# Patient Record
Sex: Male | Born: 1970 | Race: White | Hispanic: No | Marital: Married | Smoking: Never smoker
Health system: Southern US, Community
[De-identification: ages and names within clinical notes are randomized; demographics above are authoritative.]

## PROBLEM LIST (undated history)

## (undated) DIAGNOSIS — M1990 Unspecified osteoarthritis, unspecified site: Secondary | ICD-10-CM

## (undated) DIAGNOSIS — A770 Spotted fever due to Rickettsia rickettsii: Secondary | ICD-10-CM

## (undated) DIAGNOSIS — G039 Meningitis, unspecified: Secondary | ICD-10-CM

## (undated) HISTORY — PX: NECK SURGERY: SHX720

## (undated) HISTORY — PX: KNEE ARTHROSCOPY: SUR90

---

## 2012-09-05 HISTORY — PX: CARPAL TUNNEL RELEASE: SHX101

## 2017-03-23 ENCOUNTER — Other Ambulatory Visit: Payer: Self-pay | Admitting: Orthopedic Surgery

## 2017-04-05 NOTE — Pre-Procedure Instructions (Signed)
Derrick Padilla  04/05/2017      [facility] [place] - [address] [place] [id number] Phone: [phone] Fax: [phone]    Your procedure is scheduled on April 12, 2017.  [facility] at 530 AM.  Call this number if you have problems the morning of surgery:  [phone]   Remember:  Do not eat food or drink liquids after midnight.  Take these medicines the morning of surgery with A SIP OF WATER (none).  7 days prior to surgery STOP taking any Aspirin, Aleve, Naproxen, Ibuprofen, Motrin, Advil, Goody's, BC's, all herbal medications, fish oil, and all vitamins   Do not wear jewelry, make-up or nail polish.  Do not wear lotions, powders, or perfumes, or deoderant.             Men may shave face and neck.  Do not bring valuables to the hospital.  [facility] is not responsible for any belongings or valuables.  Contacts, dentures or bridgework may not be worn into surgery.  Leave your suitcase in the car.  After surgery it may be brought to your room.  For patients admitted to the hospital, discharge time will be determined by your treatment team.  Patients discharged the day of surgery will not be allowed to drive home.  Special instructions:   [place]- Preparing For Surgery  Before surgery, you can play an important role. Because skin is not sterile, your skin needs to be as free of germs as possible. You can reduce the number of germs on your skin by washing with CHG (chlorahexidine gluconate) Soap before surgery.  CHG is an antiseptic cleaner which kills germs and bonds with the skin to continue killing germs even after washing.  Please do not use if you have an allergy to CHG or antibacterial soaps. If your skin becomes reddened/irritated stop using the CHG.  Do not shave (including legs and underarms) for at least 48 hours prior to first CHG shower. It is OK to shave your  face.  Please follow these instructions carefully.   1. Shower the NIGHT BEFORE SURGERY and the MORNING OF SURGERY with CHG.   2. If you chose to wash your hair, wash your hair first as usual with your normal shampoo.  3. After you shampoo, rinse your hair and body thoroughly to remove the shampoo.  4. Use CHG as you would any other liquid soap. You can apply CHG directly to the skin and wash gently with a scrungie or a clean washcloth.   5. Apply the CHG Soap to your body ONLY FROM THE NECK DOWN.  Do not use on open wounds or open sores. Avoid contact with your eyes, ears, mouth and genitals (private parts). Wash genitals (private parts) with your normal soap.  6. Wash thoroughly, paying special attention to the area where your surgery will be performed.  7. Thoroughly rinse your body with warm water from the neck down.  8. DO NOT shower/wash with your normal soap after using and rinsing off the CHG Soap.  9. Pat yourself dry with a CLEAN TOWEL.   10. Wear CLEAN PAJAMAS   11. Place CLEAN SHEETS on your bed the night of your first shower and DO NOT SLEEP WITH PETS.    Day of Surgery: Do not apply any deodorants/lotions. Please wear clean clothes to the hospital/surgery center.     Please  read over the following fact sheets that you were given. Pain Booklet, Coughing and Deep Breathing, MRSA Information and Surgical Site Infection Prevention

## 2017-04-06 ENCOUNTER — Encounter (HOSPITAL_COMMUNITY): Payer: Self-pay

## 2017-04-06 ENCOUNTER — Encounter (HOSPITAL_COMMUNITY)
Admission: RE | Admit: 2017-04-06 | Discharge: 2017-04-06 | Disposition: A | Discharge status: Home / Self Care | Payer: Worker's Compensation | Source: Ambulatory Visit | Attending: Orthopedic Surgery | Admitting: Orthopedic Surgery

## 2017-04-06 ENCOUNTER — Ambulatory Visit (HOSPITAL_COMMUNITY)
Admission: RE | Admit: 2017-04-06 | Discharge: 2017-04-06 | Disposition: A | Discharge status: Home / Self Care | Payer: Worker's Compensation | Source: Ambulatory Visit | Attending: Orthopedic Surgery | Admitting: Orthopedic Surgery

## 2017-04-06 DIAGNOSIS — Z01812 Encounter for preprocedural laboratory examination: Secondary | ICD-10-CM | POA: Diagnosis not present

## 2017-04-06 DIAGNOSIS — Z01810 Encounter for preprocedural cardiovascular examination: Secondary | ICD-10-CM | POA: Insufficient documentation

## 2017-04-06 DIAGNOSIS — Z01818 Encounter for other preprocedural examination: Secondary | ICD-10-CM | POA: Diagnosis not present

## 2017-04-06 DIAGNOSIS — M79604 Pain in right leg: Secondary | ICD-10-CM | POA: Diagnosis not present

## 2017-04-06 DIAGNOSIS — M79605 Pain in left leg: Secondary | ICD-10-CM | POA: Insufficient documentation

## 2017-04-06 HISTORY — DX: Meningitis, unspecified: G03.9

## 2017-04-06 HISTORY — DX: Unspecified osteoarthritis, unspecified site: M19.90

## 2017-04-06 HISTORY — DX: Spotted fever due to Rickettsia rickettsii: A77.0

## 2017-04-06 LAB — COMPREHENSIVE METABOLIC PANEL
ALK PHOS: 59 U/L (ref 38–126)
ALT: 32 U/L (ref 17–63)
AST: 30 U/L (ref 15–41)
Albumin: 4.1 g/dL (ref 3.5–5.0)
Anion gap: 11 (ref 5–15)
BUN: 10 mg/dL (ref 6–20)
CO2: 25 mmol/L (ref 22–32)
Calcium: 8.9 mg/dL (ref 8.9–10.3)
Chloride: 103 mmol/L (ref 101–111)
Creatinine, Ser: 0.97 mg/dL (ref 0.61–1.24)
Glucose, Bld: 75 mg/dL (ref 65–99)
Potassium: 3.1 mmol/L — ABNORMAL LOW (ref 3.5–5.1)
SODIUM: 139 mmol/L (ref 135–145)
Total Bilirubin: 0.4 mg/dL (ref 0.3–1.2)
Total Protein: 7.2 g/dL (ref 6.5–8.1)

## 2017-04-06 LAB — URINALYSIS, ROUTINE W REFLEX MICROSCOPIC
Bilirubin Urine: NEGATIVE
Glucose, UA: NEGATIVE mg/dL
Hgb urine dipstick: NEGATIVE
KETONES UR: NEGATIVE mg/dL
LEUKOCYTES UA: NEGATIVE
NITRITE: NEGATIVE
PROTEIN: NEGATIVE mg/dL
Specific Gravity, Urine: 1.025 (ref 1.005–1.030)
pH: 5 (ref 5.0–8.0)

## 2017-04-06 LAB — CBC WITH DIFFERENTIAL/PLATELET
BASOS ABS: 0 10*3/uL (ref 0.0–0.1)
Basophils Relative: 0 %
Eosinophils Absolute: 0.1 10*3/uL (ref 0.0–0.7)
Eosinophils Relative: 2 %
HCT: 43.3 % (ref 39.0–52.0)
HEMOGLOBIN: 13.9 g/dL (ref 13.0–17.0)
LYMPHS ABS: 2.8 10*3/uL (ref 0.7–4.0)
LYMPHS PCT: 32 %
MCH: 27.6 pg (ref 26.0–34.0)
MCHC: 32.1 g/dL (ref 30.0–36.0)
MCV: 85.9 fL (ref 78.0–100.0)
Monocytes Absolute: 0.4 10*3/uL (ref 0.1–1.0)
Monocytes Relative: 5 %
NEUTROS PCT: 61 %
Neutro Abs: 5.4 10*3/uL (ref 1.7–7.7)
Platelets: 278 10*3/uL (ref 150–400)
RBC: 5.04 MIL/uL (ref 4.22–5.81)
RDW: 15.1 % (ref 11.5–15.5)
WBC: 8.8 10*3/uL (ref 4.0–10.5)

## 2017-04-06 LAB — PROTIME-INR
INR: 0.95
PROTHROMBIN TIME: 12.6 s (ref 11.4–15.2)

## 2017-04-06 LAB — SURGICAL PCR SCREEN
MRSA, PCR: NEGATIVE
STAPHYLOCOCCUS AUREUS: POSITIVE — AB

## 2017-04-06 LAB — APTT: aPTT: 34 seconds (ref 24–36)

## 2017-04-06 LAB — ABO/RH: ABO/RH(D): A POS

## 2017-04-06 NOTE — Progress Notes (Signed)
Call to Dr. Marshell Levanumonski's office, to clarify order for lumbar spine- left voicemail for Derrick Padilla to respond.

## 2017-04-06 NOTE — Progress Notes (Signed)
I called lab a second time and was informed that they had not received the blood.  I called  Facility and was told that they are working on the tube system at times time, other tubes have not arrived at their destintation.

## 2017-04-06 NOTE — Progress Notes (Signed)
   04/06/17 1546  OBSTRUCTIVE SLEEP APNEA  Have you ever been diagnosed with sleep apnea through a sleep study? No  Do you snore loudly (loud enough to be heard through closed doors)?  1  Do you often feel tired, fatigued, or sleepy during the daytime (such as falling asleep during driving or talking to someone)? 1  Has anyone observed you stop breathing during your sleep? 0  Do you have, or are you being treated for high blood pressure? 0  BMI more than 35 kg/m2? 1  Age > 50 (1-yes) 0  Neck circumference greater than:Male 16 inches or larger, Male 17inches or larger? 1  Male Gender (Yes=1) 1  Obstructive Sleep Apnea Score 5  Score 5 or greater  Results sent to PCP

## 2017-04-06 NOTE — Progress Notes (Signed)
Stop Brennan BaileyBang tool sent to PCP- Dr. Sunday Spillers. [place] wife of pt. Reports that he hasn't seen the MD in a couple of yrs. Pt. Denies all cardiac surveillance in the past, denies seeing any specialty practices. Pt. Denies all chest concerns.

## 2017-04-06 NOTE — Progress Notes (Addendum)
I called the main lab and asked about patient labs that are still pending, I was told that hey will follow up with lab tech.

## 2017-04-07 ENCOUNTER — Other Ambulatory Visit: Payer: Self-pay | Admitting: Orthopedic Surgery

## 2017-04-07 NOTE — Progress Notes (Signed)
I called a prescription for Mupirocin ointment to [facility] [place].

## 2017-04-11 MED ORDER — DEXTROSE 5 % IV SOLN
3.0000 g | INTRAVENOUS | Status: AC
  Administered 2017-04-12 (×3): 3 g via INTRAVENOUS
  Filled 2017-04-11: qty 3

## 2017-04-11 NOTE — Anesthesia Preprocedure Evaluation (Addendum)
Anesthesia Evaluation  Patient identified by MRN, date of birth, ID band Patient awake    Reviewed: Allergy & Precautions, NPO status , Patient's Chart, lab work & pertinent test results  Airway Mallampati: III  TM Distance: >3 FB Neck ROM: Full    Dental no notable dental hx.    Pulmonary neg pulmonary ROS,    Pulmonary exam normal breath sounds clear to auscultation       Cardiovascular negative cardio ROS Normal cardiovascular exam Rhythm:Regular Rate:Normal  ECG: NSR, rate 63   Neuro/Psych negative neurological ROS  negative psych ROS   GI/Hepatic negative GI ROS, Neg liver ROS,   Endo/Other  Morbid obesity  Renal/GU negative Renal ROS     Musculoskeletal negative musculoskeletal ROS (+)   Abdominal (+) + obese,   Peds  Hematology negative hematology ROS (+)   Anesthesia Other Findings   Reproductive/Obstetrics                            Anesthesia Physical Anesthesia Plan  ASA: II  Anesthesia Plan: General   Post-op Pain Management:    Induction: Intravenous  PONV Risk Score and Plan: 2 and Ondansetron, Dexamethasone and Midazolam  Airway Management Planned: Oral ETT and Video Laryngoscope Planned  Additional Equipment:   Intra-op Plan:   Post-operative Plan: Extubation in OR  Informed Consent: I have reviewed the patients History and Physical, chart, labs and discussed the procedure including the risks, benefits and alternatives for the proposed anesthesia with the patient or authorized representative who has indicated his/her understanding and acceptance.   Dental advisory given  Plan Discussed with: CRNA  Anesthesia Plan Comments:        Anesthesia Quick Evaluation

## 2017-04-12 ENCOUNTER — Inpatient Hospital Stay (HOSPITAL_COMMUNITY): Payer: Worker's Compensation | Admitting: Anesthesiology

## 2017-04-12 ENCOUNTER — Inpatient Hospital Stay (HOSPITAL_COMMUNITY)
Admission: RE | Admit: 2017-04-12 | Discharge: 2017-04-15 | DRG: 454 | Disposition: A | Discharge status: Home / Self Care | Payer: Worker's Compensation | Source: Ambulatory Visit | Attending: Orthopedic Surgery | Admitting: Orthopedic Surgery

## 2017-04-12 ENCOUNTER — Inpatient Hospital Stay (HOSPITAL_COMMUNITY): Payer: Worker's Compensation

## 2017-04-12 ENCOUNTER — Inpatient Hospital Stay (HOSPITAL_COMMUNITY)
Admission: RE | Disposition: A | Discharge status: Home / Self Care | Payer: Self-pay | Source: Ambulatory Visit | Attending: Orthopedic Surgery

## 2017-04-12 ENCOUNTER — Encounter (HOSPITAL_COMMUNITY): Payer: Self-pay

## 2017-04-12 DIAGNOSIS — M4807 Spinal stenosis, lumbosacral region: Secondary | ICD-10-CM | POA: Diagnosis present

## 2017-04-12 DIAGNOSIS — M5117 Intervertebral disc disorders with radiculopathy, lumbosacral region: Secondary | ICD-10-CM | POA: Diagnosis present

## 2017-04-12 DIAGNOSIS — Z6841 Body Mass Index (BMI) 40.0 and over, adult: Secondary | ICD-10-CM

## 2017-04-12 DIAGNOSIS — Z419 Encounter for procedure for purposes other than remedying health state, unspecified: Secondary | ICD-10-CM

## 2017-04-12 DIAGNOSIS — M79605 Pain in left leg: Secondary | ICD-10-CM | POA: Diagnosis present

## 2017-04-12 DIAGNOSIS — M5410 Radiculopathy, site unspecified: Secondary | ICD-10-CM | POA: Diagnosis present

## 2017-04-12 DIAGNOSIS — D62 Acute posthemorrhagic anemia: Secondary | ICD-10-CM | POA: Diagnosis not present

## 2017-04-12 DIAGNOSIS — M5116 Intervertebral disc disorders with radiculopathy, lumbar region: Secondary | ICD-10-CM | POA: Diagnosis present

## 2017-04-12 DIAGNOSIS — M48061 Spinal stenosis, lumbar region without neurogenic claudication: Secondary | ICD-10-CM | POA: Diagnosis present

## 2017-04-12 SURGERY — POSTERIOR LUMBAR FUSION 3 LEVEL
Anesthesia: General | Site: Spine Lumbar | Laterality: Right

## 2017-04-12 MED ORDER — SODIUM CHLORIDE 0.9% FLUSH
3.0000 mL | Freq: Two times a day (BID) | INTRAVENOUS | Status: DC
  Administered 2017-04-12 – 2017-04-15 (×6): 3 mL via INTRAVENOUS

## 2017-04-12 MED ORDER — SODIUM CHLORIDE 0.9 % IV SOLN
0.0500 ug/kg/min | INTRAVENOUS | Status: AC
  Administered 2017-04-12: 0.05 ug/kg/min via INTRAVENOUS
  Filled 2017-04-12: qty 5000

## 2017-04-12 MED ORDER — ARTIFICIAL TEARS OPHTHALMIC OINT
TOPICAL_OINTMENT | OPHTHALMIC | Status: AC
  Filled 2017-04-12: qty 3.5

## 2017-04-12 MED ORDER — PROPOFOL 500 MG/50ML IV EMUL
INTRAVENOUS | Status: DC | PRN
  Administered 2017-04-12: 13:00:00 via INTRAVENOUS
  Administered 2017-04-12: 50 ug/kg/min via INTRAVENOUS
  Administered 2017-04-12: 11:00:00 via INTRAVENOUS

## 2017-04-12 MED ORDER — OXYCODONE HCL 5 MG/5ML PO SOLN: 5.0000 mg | Freq: Once | ORAL | Status: DC | PRN

## 2017-04-12 MED ORDER — SENNOSIDES-DOCUSATE SODIUM 8.6-50 MG PO TABS: 1.0000 | ORAL_TABLET | Freq: Every evening | ORAL | Status: DC | PRN

## 2017-04-12 MED ORDER — REMIFENTANIL HCL 1 MG IV SOLR: INTRAVENOUS | Status: DC | PRN

## 2017-04-12 MED ORDER — ZOLPIDEM TARTRATE 5 MG PO TABS: 5.0000 mg | ORAL_TABLET | Freq: Every evening | ORAL | Status: DC | PRN

## 2017-04-12 MED ORDER — SODIUM CHLORIDE 0.9 % IV SOLN
INTRAVENOUS | Status: DC | PRN
  Administered 2017-04-12: 09:00:00 via INTRAVENOUS

## 2017-04-12 MED ORDER — 0.9 % SODIUM CHLORIDE (POUR BTL) OPTIME
TOPICAL | Status: DC | PRN
  Administered 2017-04-12: 1000 mL
  Administered 2017-04-12: 250 mL
  Administered 2017-04-12 (×7): 1000 mL

## 2017-04-12 MED ORDER — OXYCODONE-ACETAMINOPHEN 5-325 MG PO TABS
1.0000 | ORAL_TABLET | ORAL | Status: DC | PRN
  Administered 2017-04-13 – 2017-04-15 (×11): 2 via ORAL
  Filled 2017-04-12 (×11): qty 2

## 2017-04-12 MED ORDER — HYDROMORPHONE HCL 1 MG/ML IJ SOLN
INTRAMUSCULAR | Status: AC
  Filled 2017-04-12: qty 1

## 2017-04-12 MED ORDER — LIDOCAINE 2% (20 MG/ML) 5 ML SYRINGE
INTRAMUSCULAR | Status: AC
  Filled 2017-04-12: qty 5

## 2017-04-12 MED ORDER — ARTIFICIAL TEARS OPHTHALMIC OINT
TOPICAL_OINTMENT | OPHTHALMIC | Status: DC | PRN
  Administered 2017-04-12: 1 via OPHTHALMIC

## 2017-04-12 MED ORDER — FLEET ENEMA 7-19 GM/118ML RE ENEM: 1.0000 | ENEMA | Freq: Once | RECTAL | Status: DC | PRN

## 2017-04-12 MED ORDER — BUPIVACAINE LIPOSOME 1.3 % IJ SUSP
20.0000 mL | INTRAMUSCULAR | Status: AC
  Administered 2017-04-12: 20 mL
  Filled 2017-04-12: qty 20

## 2017-04-12 MED ORDER — SODIUM CHLORIDE 0.9 % IV SOLN: 250.0000 mL | INTRAVENOUS | Status: DC

## 2017-04-12 MED ORDER — ONDANSETRON HCL 4 MG PO TABS: 4.0000 mg | ORAL_TABLET | Freq: Four times a day (QID) | ORAL | Status: DC | PRN

## 2017-04-12 MED ORDER — ONDANSETRON HCL 4 MG/2ML IJ SOLN
INTRAMUSCULAR | Status: AC
  Filled 2017-04-12: qty 2

## 2017-04-12 MED ORDER — THROMBIN 20000 UNITS EX SOLR
CUTANEOUS | Status: AC
  Filled 2017-04-12: qty 40000

## 2017-04-12 MED ORDER — FENTANYL CITRATE (PF) 250 MCG/5ML IJ SOLN
INTRAMUSCULAR | Status: AC
  Filled 2017-04-12: qty 5

## 2017-04-12 MED ORDER — ROCURONIUM BROMIDE 100 MG/10ML IV SOLN
INTRAVENOUS | Status: DC | PRN
  Administered 2017-04-12 (×3): 25 mg via INTRAVENOUS
  Administered 2017-04-12: 60 mg via INTRAVENOUS
  Administered 2017-04-12: 25 mg via INTRAVENOUS
  Administered 2017-04-12: 40 mg via INTRAVENOUS

## 2017-04-12 MED ORDER — METHYLENE BLUE 0.5 % INJ SOLN
INTRAVENOUS | Status: AC
  Filled 2017-04-12: qty 10

## 2017-04-12 MED ORDER — MIDAZOLAM HCL 2 MG/2ML IJ SOLN
INTRAMUSCULAR | Status: AC
  Filled 2017-04-12: qty 2

## 2017-04-12 MED ORDER — LIDOCAINE HCL (CARDIAC) 20 MG/ML IV SOLN
INTRAVENOUS | Status: DC | PRN
  Administered 2017-04-12: 60 mg via INTRATRACHEAL

## 2017-04-12 MED ORDER — DOCUSATE SODIUM 100 MG PO CAPS
100.0000 mg | ORAL_CAPSULE | Freq: Two times a day (BID) | ORAL | Status: DC
  Administered 2017-04-12 – 2017-04-15 (×6): 100 mg via ORAL
  Filled 2017-04-12 (×6): qty 1

## 2017-04-12 MED ORDER — PANTOPRAZOLE SODIUM 40 MG IV SOLR
40.0000 mg | Freq: Every day | INTRAVENOUS | Status: DC
  Administered 2017-04-12: 40 mg via INTRAVENOUS
  Filled 2017-04-12: qty 40

## 2017-04-12 MED ORDER — DIAZEPAM 5 MG PO TABS
5.0000 mg | ORAL_TABLET | Freq: Four times a day (QID) | ORAL | Status: DC | PRN
  Administered 2017-04-13 – 2017-04-15 (×7): 5 mg via ORAL
  Filled 2017-04-12 (×7): qty 1

## 2017-04-12 MED ORDER — PHENYLEPHRINE HCL 10 MG/ML IJ SOLN
INTRAVENOUS | Status: DC | PRN
  Administered 2017-04-12: 12:00:00 via INTRAVENOUS
  Administered 2017-04-12: 25 ug/min via INTRAVENOUS

## 2017-04-12 MED ORDER — ONDANSETRON HCL 4 MG/2ML IJ SOLN
INTRAMUSCULAR | Status: DC | PRN
  Administered 2017-04-12 (×2): 4 mg via INTRAVENOUS

## 2017-04-12 MED ORDER — POVIDONE-IODINE 7.5 % EX SOLN
Freq: Once | CUTANEOUS | Status: DC
  Filled 2017-04-12: qty 118

## 2017-04-12 MED ORDER — CEFAZOLIN SODIUM-DEXTROSE 2-4 GM/100ML-% IV SOLN
INTRAVENOUS | Status: AC
  Filled 2017-04-12: qty 100

## 2017-04-12 MED ORDER — GLYCOPYRROLATE 0.2 MG/ML IJ SOLN
INTRAMUSCULAR | Status: DC | PRN
  Administered 2017-04-12 (×3): 0.2 mg via INTRAVENOUS

## 2017-04-12 MED ORDER — ALUM & MAG HYDROXIDE-SIMETH 200-200-20 MG/5ML PO SUSP: 30.0000 mL | Freq: Four times a day (QID) | ORAL | Status: DC | PRN

## 2017-04-12 MED ORDER — PROMETHAZINE HCL 25 MG/ML IJ SOLN: 6.2500 mg | INTRAMUSCULAR | Status: DC | PRN

## 2017-04-12 MED ORDER — SODIUM CHLORIDE 0.9 % IV SOLN
INTRAVENOUS | Status: DC | PRN
  Administered 2017-04-12: 11:00:00 via INTRAVENOUS

## 2017-04-12 MED ORDER — HEMOSTATIC AGENTS (NO CHARGE) OPTIME
TOPICAL | Status: DC | PRN
  Administered 2017-04-12: 1 via TOPICAL

## 2017-04-12 MED ORDER — SUGAMMADEX SODIUM 500 MG/5ML IV SOLN
INTRAVENOUS | Status: DC | PRN
  Administered 2017-04-12: 300 mg via INTRAVENOUS

## 2017-04-12 MED ORDER — PHENYLEPHRINE HCL 10 MG/ML IJ SOLN
INTRAMUSCULAR | Status: DC | PRN
  Administered 2017-04-12 (×2): 80 ug via INTRAVENOUS
  Administered 2017-04-12: 40 ug via INTRAVENOUS
  Administered 2017-04-12: 80 ug via INTRAVENOUS
  Administered 2017-04-12: 120 ug via INTRAVENOUS

## 2017-04-12 MED ORDER — PROPOFOL 10 MG/ML IV BOLUS
INTRAVENOUS | Status: AC
  Filled 2017-04-12: qty 20

## 2017-04-12 MED ORDER — CEFAZOLIN SODIUM-DEXTROSE 2-4 GM/100ML-% IV SOLN
2.0000 g | Freq: Three times a day (TID) | INTRAVENOUS | Status: AC
  Administered 2017-04-13 (×2): 2 g via INTRAVENOUS
  Filled 2017-04-12 (×2): qty 100

## 2017-04-12 MED ORDER — PROPOFOL 10 MG/ML IV BOLUS
INTRAVENOUS | Status: DC | PRN
  Administered 2017-04-12: 200 mg via INTRAVENOUS

## 2017-04-12 MED ORDER — CEFAZOLIN SODIUM 1 G IJ SOLR
INTRAMUSCULAR | Status: AC
  Filled 2017-04-12: qty 30

## 2017-04-12 MED ORDER — ONDANSETRON HCL 4 MG/2ML IJ SOLN: 4.0000 mg | Freq: Four times a day (QID) | INTRAMUSCULAR | Status: DC | PRN

## 2017-04-12 MED ORDER — ROCURONIUM BROMIDE 10 MG/ML (PF) SYRINGE
PREFILLED_SYRINGE | INTRAVENOUS | Status: AC
  Filled 2017-04-12: qty 5

## 2017-04-12 MED ORDER — SODIUM CHLORIDE 0.9 % IV BOLUS (SEPSIS)
500.0000 mL | Freq: Once | INTRAVENOUS | Status: AC
  Administered 2017-04-12: 500 mL via INTRAVENOUS

## 2017-04-12 MED ORDER — ACETAMINOPHEN 650 MG RE SUPP: 650.0000 mg | RECTAL | Status: DC | PRN

## 2017-04-12 MED ORDER — SODIUM CHLORIDE 0.9 % IV BOLUS (SEPSIS)
1000.0000 mL | Freq: Once | INTRAVENOUS | Status: AC
  Administered 2017-04-12: 1000 mL via INTRAVENOUS

## 2017-04-12 MED ORDER — SUGAMMADEX SODIUM 500 MG/5ML IV SOLN
INTRAVENOUS | Status: AC
  Filled 2017-04-12: qty 5

## 2017-04-12 MED ORDER — ACETAMINOPHEN 325 MG PO TABS: 650.0000 mg | ORAL_TABLET | ORAL | Status: DC | PRN

## 2017-04-12 MED ORDER — POTASSIUM CHLORIDE IN NACL 20-0.9 MEQ/L-% IV SOLN
INTRAVENOUS | Status: DC
  Administered 2017-04-12 – 2017-04-13 (×2): via INTRAVENOUS
  Filled 2017-04-12 (×2): qty 1000

## 2017-04-12 MED ORDER — THROMBIN 20000 UNITS EX KIT
PACK | CUTANEOUS | Status: DC | PRN
  Administered 2017-04-12 (×2): 20000 [IU] via TOPICAL

## 2017-04-12 MED ORDER — MORPHINE SULFATE (PF) 2 MG/ML IV SOLN
1.0000 mg | INTRAVENOUS | Status: DC | PRN
  Administered 2017-04-12 – 2017-04-13 (×2): 2 mg via INTRAVENOUS
  Filled 2017-04-12 (×2): qty 1

## 2017-04-12 MED ORDER — BISACODYL 5 MG PO TBEC: 5.0000 mg | DELAYED_RELEASE_TABLET | Freq: Every day | ORAL | Status: DC | PRN

## 2017-04-12 MED ORDER — PHENOL 1.4 % MT LIQD
1.0000 | OROMUCOSAL | Status: DC | PRN
  Administered 2017-04-15: 1 via OROMUCOSAL
  Filled 2017-04-12: qty 177

## 2017-04-12 MED ORDER — PHENYLEPHRINE 40 MCG/ML (10ML) SYRINGE FOR IV PUSH (FOR BLOOD PRESSURE SUPPORT)
PREFILLED_SYRINGE | INTRAVENOUS | Status: AC
  Filled 2017-04-12: qty 10

## 2017-04-12 MED ORDER — HYDROMORPHONE HCL 1 MG/ML IJ SOLN
0.2500 mg | INTRAMUSCULAR | Status: DC | PRN
  Administered 2017-04-12 (×2): 0.5 mg via INTRAVENOUS

## 2017-04-12 MED ORDER — LACTATED RINGERS IV SOLN
INTRAVENOUS | Status: DC | PRN
  Administered 2017-04-12: 08:00:00 via INTRAVENOUS

## 2017-04-12 MED ORDER — METHYLENE BLUE 0.5 % INJ SOLN
INTRAVENOUS | Status: DC | PRN
  Administered 2017-04-12: 1 mL via SUBMUCOSAL

## 2017-04-12 MED ORDER — ALBUMIN HUMAN 5 % IV SOLN
INTRAVENOUS | Status: DC | PRN
  Administered 2017-04-12 (×2): via INTRAVENOUS

## 2017-04-12 MED ORDER — PROPOFOL 1000 MG/100ML IV EMUL
INTRAVENOUS | Status: AC
  Filled 2017-04-12: qty 400

## 2017-04-12 MED ORDER — BUPIVACAINE-EPINEPHRINE (PF) 0.25% -1:200000 IJ SOLN
INTRAMUSCULAR | Status: AC
  Filled 2017-04-12: qty 30

## 2017-04-12 MED ORDER — MENTHOL 3 MG MT LOZG: 1.0000 | LOZENGE | OROMUCOSAL | Status: DC | PRN

## 2017-04-12 MED ORDER — OXYCODONE HCL 5 MG PO TABS: 5.0000 mg | ORAL_TABLET | Freq: Once | ORAL | Status: DC | PRN

## 2017-04-12 MED ORDER — FENTANYL CITRATE (PF) 250 MCG/5ML IJ SOLN
INTRAMUSCULAR | Status: DC | PRN
  Administered 2017-04-12: 50 ug via INTRAVENOUS
  Administered 2017-04-12: 150 ug via INTRAVENOUS

## 2017-04-12 MED ORDER — BUPIVACAINE LIPOSOME 1.3 % IJ SUSP: INTRAMUSCULAR | Status: DC | PRN

## 2017-04-12 MED ORDER — SODIUM CHLORIDE 0.9% FLUSH: 3.0000 mL | INTRAVENOUS | Status: DC | PRN

## 2017-04-12 MED ORDER — BUPIVACAINE-EPINEPHRINE 0.25% -1:200000 IJ SOLN
INTRAMUSCULAR | Status: DC | PRN
  Administered 2017-04-12: 8 mL
  Administered 2017-04-12: 20 mL

## 2017-04-12 MED ORDER — MIDAZOLAM HCL 2 MG/2ML IJ SOLN
INTRAMUSCULAR | Status: DC | PRN
  Administered 2017-04-12 (×2): 1 mg via INTRAVENOUS

## 2017-04-12 SURGICAL SUPPLY — 92 items
BENZOIN TINCTURE PRP APPL 2/3 (GAUZE/BANDAGES/DRESSINGS) ×3 IMPLANT
BLADE CLIPPER SURG (BLADE) ×3 IMPLANT
BONE MATRIX VIVIGEN 16.CC/XL (Bone Implant) ×3 IMPLANT
BUR PRESCISION 1.7 ELITE (BURR) ×3 IMPLANT
BUR ROUND PRECISION 4.0 (BURR) IMPLANT
BUR ROUND PRECISION 4.0MM (BURR)
BUR SABER RD CUTTING 3.0 (BURR) IMPLANT
BUR SABER RD CUTTING 3.0MM (BURR)
CAGE CONCORDE BULLET 9X8X27 (Cage) ×3 IMPLANT
CAGE CONCORDE BULLET 9X8X27MM (Cage) ×3 IMPLANT
CAGE SPNL 5D BLT NOSE 27X9X8X (Cage) ×2 IMPLANT
CAGE SPNL PRLL BLT NOSE 27X9X8 (Cage) ×1 IMPLANT
CARTRIDGE OIL MAESTRO DRILL (MISCELLANEOUS) ×1 IMPLANT
CLOSURE WOUND 1/2 X4 (GAUZE/BANDAGES/DRESSINGS) ×2
COVER SURGICAL LIGHT HANDLE (MISCELLANEOUS) ×3 IMPLANT
DIFFUSER DRILL AIR PNEUMATIC (MISCELLANEOUS) ×3 IMPLANT
DRAIN CHANNEL 15F RND FF W/TCR (WOUND CARE) ×3 IMPLANT
DRAPE C-ARM 42X72 X-RAY (DRAPES) ×3 IMPLANT
DRAPE C-ARMOR (DRAPES) IMPLANT
DRAPE ORTHO SPLIT 77X108 STRL (DRAPES) ×2
DRAPE POUCH INSTRU U-SHP 10X18 (DRAPES) ×3 IMPLANT
DRAPE SURG 17X23 STRL (DRAPES) ×12 IMPLANT
DRAPE SURG ORHT 6 SPLT 77X108 (DRAPES) ×1 IMPLANT
DRSG MEPILEX BORDER 4X12 (GAUZE/BANDAGES/DRESSINGS) IMPLANT
DRSG MEPILEX BORDER 4X8 (GAUZE/BANDAGES/DRESSINGS) IMPLANT
DURAPREP 26ML APPLICATOR (WOUND CARE) ×3 IMPLANT
ELECT BLADE 4.0 EZ CLEAN MEGAD (MISCELLANEOUS) ×3
ELECT CAUTERY BLADE 6.4 (BLADE) ×3 IMPLANT
ELECT REM PT RETURN 9FT ADLT (ELECTROSURGICAL) ×3
ELECTRODE BLDE 4.0 EZ CLN MEGD (MISCELLANEOUS) ×1 IMPLANT
ELECTRODE REM PT RTRN 9FT ADLT (ELECTROSURGICAL) ×1 IMPLANT
EVACUATOR SILICONE 100CC (DRAIN) ×3 IMPLANT
EXPAREL 1. 3% 266MG IMPLANT
FEE INTRAOP MONITOR IMPULS NCS (MISCELLANEOUS) ×1 IMPLANT
FILTER STRAW FLUID ASPIR (MISCELLANEOUS) ×3 IMPLANT
GAUZE SPONGE 4X4 12PLY STRL (GAUZE/BANDAGES/DRESSINGS) ×3 IMPLANT
GAUZE SPONGE 4X4 16PLY XRAY LF (GAUZE/BANDAGES/DRESSINGS) ×12 IMPLANT
GLOVE BIO SURGEON STRL SZ7 (GLOVE) ×6 IMPLANT
GLOVE BIO SURGEON STRL SZ7.5 (GLOVE) ×6 IMPLANT
GLOVE BIO SURGEON STRL SZ8 (GLOVE) ×3 IMPLANT
GLOVE BIOGEL PI IND STRL 7.0 (GLOVE) ×1 IMPLANT
GLOVE BIOGEL PI IND STRL 8 (GLOVE) ×1 IMPLANT
GLOVE BIOGEL PI INDICATOR 7.0 (GLOVE) ×2
GLOVE BIOGEL PI INDICATOR 8 (GLOVE) ×2
GOWN STRL REUS W/ TWL LRG LVL3 (GOWN DISPOSABLE) ×2 IMPLANT
GOWN STRL REUS W/ TWL XL LVL3 (GOWN DISPOSABLE) ×1 IMPLANT
GOWN STRL REUS W/TWL LRG LVL3 (GOWN DISPOSABLE) ×4
GOWN STRL REUS W/TWL XL LVL3 (GOWN DISPOSABLE) ×2
INTRAOP MONITOR FEE IMPULS NCS (MISCELLANEOUS) ×1
INTRAOP MONITOR FEE IMPULSE (MISCELLANEOUS) ×2
IV CATH 14GX2 1/4 (CATHETERS) ×3 IMPLANT
KIT BASIN OR (CUSTOM PROCEDURE TRAY) ×3 IMPLANT
KIT POSITION SURG JACKSON T1 (MISCELLANEOUS) ×3 IMPLANT
KIT ROOM TURNOVER OR (KITS) ×3 IMPLANT
MARKER SKIN DUAL TIP RULER LAB (MISCELLANEOUS) ×6 IMPLANT
NEEDLE HYPO 25GX1X1/2 BEV (NEEDLE) ×3 IMPLANT
NEEDLE SPNL 18GX3.5 QUINCKE PK (NEEDLE) ×6 IMPLANT
NS IRRIG 1000ML POUR BTL (IV SOLUTION) ×27 IMPLANT
OIL CARTRIDGE MAESTRO DRILL (MISCELLANEOUS) ×3
PACK LAMINECTOMY ORTHO (CUSTOM PROCEDURE TRAY) ×3 IMPLANT
PACK UNIVERSAL I (CUSTOM PROCEDURE TRAY) ×3 IMPLANT
PAD ARMBOARD 7.5X6 YLW CONV (MISCELLANEOUS) ×6 IMPLANT
PATTIES SURGICAL .5 X1 (DISPOSABLE) ×3 IMPLANT
PATTIES SURGICAL .5 X3 (DISPOSABLE) ×3 IMPLANT
PATTIES SURGICAL .5X1.5 (GAUZE/BANDAGES/DRESSINGS) ×3 IMPLANT
PROBE BALL TIP PED SCREW 2.3M (INSTRUMENTS) ×3 IMPLANT
ROD EXPEDIUM PREBENT 85MM (Rod) ×6 IMPLANT
SCREW SET SINGLE INNER (Screw) ×24 IMPLANT
SCREW VIPER CORTICAL FIX 6X40 (Screw) ×24 IMPLANT
SPONGE INTESTINAL PEANUT (DISPOSABLE) ×6 IMPLANT
SPONGE LAP 4X18 X RAY DECT (DISPOSABLE) ×18 IMPLANT
SPONGE SURGIFOAM ABS GEL 100 (HEMOSTASIS) ×3 IMPLANT
STRIP CLOSURE SKIN 1/2X4 (GAUZE/BANDAGES/DRESSINGS) ×4 IMPLANT
STYLET INTUB SATIN SLIP 14FR (MISCELLANEOUS) ×3 IMPLANT
SURGIFLO W/THROMBIN 8M KIT (HEMOSTASIS) IMPLANT
SUT ETHILON 3 0 PS 1 (SUTURE) ×3 IMPLANT
SUT MNCRL AB 4-0 PS2 18 (SUTURE) ×3 IMPLANT
SUT VIC AB 0 CT1 18XCR BRD 8 (SUTURE) ×2 IMPLANT
SUT VIC AB 0 CT1 8-18 (SUTURE) ×4
SUT VIC AB 1 CT1 18XCR BRD 8 (SUTURE) ×2 IMPLANT
SUT VIC AB 1 CT1 8-18 (SUTURE) ×4
SUT VIC AB 2-0 CT2 18 VCP726D (SUTURE) ×6 IMPLANT
SYR 20CC LL (SYRINGE) ×3 IMPLANT
SYR BULB IRRIGATION 50ML (SYRINGE) ×3 IMPLANT
SYR CONTROL 10ML LL (SYRINGE) ×3 IMPLANT
SYR TB 1ML LUER SLIP (SYRINGE) ×3 IMPLANT
TAPE CLOTH SURG 6X10 WHT LF (GAUZE/BANDAGES/DRESSINGS) ×3 IMPLANT
TOWEL OR 17X24 6PK STRL BLUE (TOWEL DISPOSABLE) ×3 IMPLANT
TOWEL OR 17X26 10 PK STRL BLUE (TOWEL DISPOSABLE) ×3 IMPLANT
TRAY FOLEY W/METER SILVER 16FR (SET/KITS/TRAYS/PACK) ×3 IMPLANT
WATER STERILE IRR 1000ML POUR (IV SOLUTION) ×3 IMPLANT
YANKAUER SUCT BULB TIP NO VENT (SUCTIONS) ×3 IMPLANT

## 2017-04-12 NOTE — H&P (Signed)
     PREOPERATIVE H&P  Chief Complaint: Bilateral leg pain  HPI: Derrick Padilla is a 46 y.o. male who presents with ongoing pain in the bilateral legs x 9 months. Patient has failed multiple conservative treatment measures.   MRI reveals severe DDD in addition to disc protrusions and stenosis at L3/4, L4/5, and L5/S1  Patient has failed multiple forms of conservative care and continues to have pain (see office notes for additional details regarding the patient's full course of treatment)  Past Medical History:  Diagnosis Date  . Arthritis    degenerative spine, has had multiple injections   . Meningitis    (viral) - hosp.- [place]  . [place] tick fever    Past Surgical History:  Procedure Laterality Date  . CARPAL TUNNEL RELEASE Left 2014  . KNEE ARTHROSCOPY Left   . NECK SURGERY     cyst- excision - benign    Social History   Social History  . Marital status: Married    Spouse name: N/A  . Number of children: N/A  . Years of education: N/A   Social History Main Topics  . Smoking status: Never Smoker  . Smokeless tobacco: Never Used  . Alcohol use No  . Drug use: No  . Sexual activity: Not Asked   Other Topics Concern  . None   Social History Narrative  . None   History reviewed. No pertinent family history. Allergies  Allergen Reactions  . No Known Allergies    Prior to Admission medications   Medication Sig Start Date End Date Taking? Authorizing Provider  ibuprofen (ADVIL,MOTRIN) 200 MG tablet Take 200 mg by mouth every 6 (six) hours as needed.   Yes [provider]     All other systems have been reviewed and were otherwise negative with the exception of those mentioned in the HPI and as above.  Physical Exam: Vitals:   04/12/17 0705  BP: 140/75  Pulse: (!) 56  Resp: 18  Temp: 97.7 F (36.5 C)    General: Alert, no acute distress Cardiovascular: No pedal edema Respiratory: No cyanosis, no use of accessory musculature Skin:  No lesions in the area of chief complaint Neurologic: Sensation intact distally Psychiatric: Patient is competent for consent with normal mood and affect Lymphatic: No axillary or cervical lymphadenopathy   Assessment/Plan: L3-S1 DDD L3-S1 stenosis Bilateral leg pain  Plan for Procedure(s): RIGHT SIDED LUMBAR 3-4, LUMBAR 4-5, LUMBAR 5-SACRUM 1 DECOMPRESSION AND TRANSFORAMINAL LUMBAR INTERBODY FUSION WITH INSTRUMENTATION AND ALLOGRAFT   Emilee HeroUMONSKI,Zarria Towell [name], MD 04/12/2017 8:04 AM

## 2017-04-12 NOTE — Anesthesia Procedure Notes (Addendum)
Procedure Name: Intubation Date/Time: 04/12/2017 8:45 AM Performed by: Leonides GrillsELLENDER, RYAN P Pre-anesthesia Checklist: Patient identified, Emergency Drugs available, Suction available and Patient being monitored Patient Re-evaluated:Patient Re-evaluated prior to induction Oxygen Delivery Method: Circle system utilized Preoxygenation: Pre-oxygenation with 100% oxygen Induction Type: IV induction Ventilation: Mask ventilation with difficulty and Oral airway inserted - appropriate to patient size Laryngoscope Size: Glidescope (T4) Grade View: Grade III Tube type: Oral Tube size: 7.5 mm Number of attempts: 2 (Repositioned head and PPV) Airway Equipment and Method: Video-laryngoscopy Placement Confirmation: ETT inserted through vocal cords under direct vision,  positive ETCO2 and breath sounds checked- equal and bilateral Secured at: 23 cm Tube secured with: Tape Dental Injury: Teeth and Oropharynx as per pre-operative assessment  Difficulty Due To: Difficulty was anticipated Future Recommendations: Recommend- induction with short-acting agent, and alternative techniques readily available

## 2017-04-12 NOTE — Progress Notes (Signed)
Pt admitted from PACU post op, alert and oriented, c/o of pain with a rating of 6, pt settled in bed with call light and family at bedside, safety concerned addressed accordingly with pt and family, was however reassured and will continue to monitor. Obasogie-Asidi, Samaya Boardley Efe

## 2017-04-12 NOTE — Anesthesia Postprocedure Evaluation (Signed)
Anesthesia Post Note  Patient: Derrick Padilla  Procedure(s) Performed: Procedure(s) (LRB): RIGHT SIDED LUMBAR 3-4, LUMBAR 4-5, LUMBAR 5-SACRUM 1 TRANSFORAMINAL LUMBAR INTERBODY FUSION WITH INSTRUMENTATION AND ALLOGRAFT (Right)     Patient location during evaluation: PACU Anesthesia Type: General Level of consciousness: awake and alert Pain management: pain level controlled Vital Signs Assessment: post-procedure vital signs reviewed and stable Respiratory status: spontaneous breathing, nonlabored ventilation, respiratory function stable and patient connected to nasal cannula oxygen Cardiovascular status: blood pressure returned to baseline and stable Postop Assessment: no signs of nausea or vomiting Anesthetic complications: no    Last Vitals:  Vitals:   04/12/17 2130 04/12/17 2145  BP: 100/60 (!) 99/58  Pulse: 64 69  Resp: (!) 24 12  Temp: 36.6 C     Last Pain:  Vitals:   04/12/17 2100  TempSrc:   PainSc: Asleep                 [name] P Rual Vermeer

## 2017-04-12 NOTE — Transfer of Care (Signed)
Immediate Anesthesia Transfer of Care Note  Patient: Derrick Padilla  Procedure(s) Performed: Procedure(s) with comments: RIGHT SIDED LUMBAR 3-4, LUMBAR 4-5, LUMBAR 5-SACRUM 1 TRANSFORAMINAL LUMBAR INTERBODY FUSION WITH INSTRUMENTATION AND ALLOGRAFT (Right) - RIGHT SIDED LUMBAR 3-4, LUMBAR 4-5, LUMBAR 5-SACRUM 1 TRANSFORAMINAL LUMBAR INTERBODY FUSION WITH INSTRUMENTATION AND ALLOGRAFT; REQUEST 6.5 HOURS  Patient Location: PACU  Anesthesia Type:General  Level of Consciousness: oriented, sedated, drowsy, patient cooperative and responds to stimulation  Airway & Oxygen Therapy: Patient Spontanous Breathing and Patient connected to nasal cannula oxygen  Post-op Assessment: Report given to RN, Post -op Vital signs reviewed and stable and Patient moving all extremities X 4  Post vital signs: Reviewed and stable  Last Vitals:  Vitals:   04/12/17 0705  BP: 140/75  Pulse: (!) 56  Resp: 18  Temp: 36.5 C    Last Pain:  Vitals:   04/12/17 0705  TempSrc: Oral  PainSc:          Complications: No apparent anesthesia complications

## 2017-04-12 NOTE — Progress Notes (Signed)
Dr. Bradley FerrisEllender notified of patient's BP.  Order received to give 1000ml NS bolus.  Will continue to monitor.

## 2017-04-13 LAB — CBC
HEMATOCRIT: 30.4 % — AB (ref 39.0–52.0)
Hemoglobin: 9.9 g/dL — ABNORMAL LOW (ref 13.0–17.0)
MCH: 28 pg (ref 26.0–34.0)
MCHC: 32.6 g/dL (ref 30.0–36.0)
MCV: 86.1 fL (ref 78.0–100.0)
Platelets: 189 10*3/uL (ref 150–400)
RBC: 3.53 MIL/uL — ABNORMAL LOW (ref 4.22–5.81)
RDW: 15.2 % (ref 11.5–15.5)
WBC: 9.6 10*3/uL (ref 4.0–10.5)

## 2017-04-13 MED ORDER — MORPHINE SULFATE (PF) 2 MG/ML IV SOLN
1.0000 mg | INTRAVENOUS | Status: DC | PRN
  Administered 2017-04-14 – 2017-04-15 (×3): 2 mg via INTRAVENOUS
  Filled 2017-04-13 (×3): qty 1

## 2017-04-13 MED ORDER — PANTOPRAZOLE SODIUM 40 MG PO TBEC
40.0000 mg | DELAYED_RELEASE_TABLET | Freq: Every day | ORAL | Status: DC
  Administered 2017-04-13 – 2017-04-14 (×2): 40 mg via ORAL
  Filled 2017-04-13 (×2): qty 1

## 2017-04-13 MED FILL — Heparin Sodium (Porcine) Inj 1000 Unit/ML: INTRAMUSCULAR | Qty: 30 | Status: AC

## 2017-04-13 MED FILL — Sodium Chloride IV Soln 0.9%: INTRAVENOUS | Qty: 2000 | Status: AC

## 2017-04-13 NOTE — Progress Notes (Signed)
    Patient doing well Minimal - moderate LBP, as expected Patient denies leg pain Patient has not been mobile Patient describes numbness in ulnar 1.5 fingers on right hand   Physical Exam: Vitals:   04/13/17 0136 04/13/17 0600  BP: (!) 92/57 104/61  Pulse: 78 82  Resp: 18 18  Temp: 100 F (37.8 C) 99.7 F (37.6 C)    Dressing in place NVI  Hg: 9.9  Drain output: 175/10 hours  POD #1 s/p L3-S1 decompression and fusion, doing well  - up with PT/OT, encourage ambulation - Percocet for pain, Valium for muscle spasms - likely d/c home today vs tomorrow, depending on PT progress - maintain drain for now  - hand numbness certainly due to right ulnar nerve from positioning. I did ask the monitoring technician specifically if if ulnar nerves signals were OK during surgery yesterday, and he stated that signals were normal. Patient is however having numbness from ulnar nerve on the right, despite signals being reportedly normal. We will keep an eye on this. This should improve over time.

## 2017-04-13 NOTE — Op Note (Signed)
NAME:  Ficek, Deon                  ACCOUNT NO.:  [id number]  MEDICAL RECORD NO.:  [id number]  PHYSICIAN:  Estill Bamberg, MD           DATE OF BIRTH:  DATE OF PROCEDURE:  04/12/2017                              OPERATIVE REPORT   PREOPERATIVE DIAGNOSES: 1. Bilateral lumbar radiculopathy. 2. Severe degenerative disk disease, L3-4, L4-5, and L5-S1. 3. Varying degrees of stenosis spanning L3-S1.  POSTOPERATIVE DIAGNOSES: 1. Bilateral lumbar radiculopathy. 2. Severe degenerative disk disease, L3-4, L4-5, and L5-S1. 3. Varying degrees of stenosis spanning L3-S1.  PROCEDURE: 1. Right-sided transforaminal lumbar interbody fusion, L3-4, L4-5, L5-     S1. 2. Left-sided posterolateral fusion, L3-4, L4-5, L5-S1. 3. Lumbar decompression L3-4, L4-5, L5-S1. 4. Insertion of interbody device x3 (DePuy Concorde bullet spacer, 8     mm). 5. Placement of posterior segmental instrumentation, L3, L4, L5, S1,     bilateral. 6. Use of local autograft. 7. Use of morselized allograft - ViviGen. 8. Intraoperative use of fluoroscopy.  SURGEON:  Estill Bamberg, MD.  ASSISTANTJason Coop, PA-C.  ANESTHESIA:  General endotracheal anesthesia.  COMPLICATIONS:  None.  DISPOSITION:  Stable.  ESTIMATED BLOOD LOSS:  1 L with 500 mL of blood retransfused via Cell Saver.  INDICATIONS FOR SURGERY:  Briefly, Mr. Ashok Norris is a pleasant 46 year old male, who did present to me status post a work injury that did occur on July 12, 2016.  He did go on to develop pain in both his back and bilateral legs.  His MRI did reveal severe degenerative disk disease at the levels outlined above, as well as protrusions and disk herniations at the levels outlined above.  The patient did obtain temporary relief with injections and did fail other forms of conservative care including physical therapy.  Given the patient's ongoing pain and dysfunction, we did discuss proceeding with the plan outlined above.  The patient  was fully aware of the risks and limitations of surgery and did elect to proceed.  OPERATIVE DETAILS:  On April 12, 2017, the patient was brought to surgery and general endotracheal anesthesia was administered.  The patient was placed prone on a well-padded flat Jackson bed with a spinal frame.  Antibiotics were given and a time-out procedure was performed. The back was prepped and draped, and a midline incision was made spanning approximately L3-S1.  The fascia was incised in the midline. The paraspinal musculature was retracted bilaterally.  The lamina from L3-S1 was identified and subperiosteally exposed.  Using AP and lateral fluoroscopy, I did cannulate the pedicles of L3, L4, L5, and S1 bilaterally, using a cortical medial to lateral trajectory technique.  I used a high-speed bur to decorticate the posterior elements, facet joints, and posterolateral gutter on the left side.  The 6 x 40 mm screws were placed from L3-S1.  An 85 mm rod was then secured into the tulip heads of the screws.  Caps were then placed and provisionally tightened.  On the right side, I did place bone wax in the cannulated pedicle holes.  I then proceeded with the decompression at L5-S1, followed by L4-5, followed by L3-4, I was pleased with the decompression that I was able to accomplish bilaterally.  I then turned my attention to the L5-S1 intervertebral space.  With an assistant holding medial retraction of the traversing right S1 nerve, I did use a 15 blade knife to perform an annulotomy at the posterolateral aspect of the L5-S1 intervertebral space on its right side.  I then proceeded with a thorough and complete L5-S1 intervertebral diskectomy.  The intervertebral space was then liberally packed with allograft as well as autograft and the appropriate size interbody spacer was also packed with allograft and autograft and tamped into position.  I then turned my attention to the L4-5 intervertebral space.   In the manner previously described, the traversing right L5 nerve was medially retracted, and a thorough and complete L4-5 intervertebral diskectomy was performed.  The intervertebral space was then liberally packed with autograft and allograft, as well as the appropriate size intervertebral spacer.  The spacer was then tamped into position in the usual fashion.  At the L3-4 level, once again, the traversing nerve was medially retracted, a thorough and complete L3-4 intervertebral diskectomy was performed and the appropriate size interbody spacer was packed with allograft and autograft, as well as the intervertebral space.  The intervertebral spacer was then tamped into position.  I was very pleased with the resting position of the implants.  At this point, I did place 6 x 40 mm screws into the pedicles from L3-S1 on the right side.  An 85 mm rod was then placed.  Caps were then placed and a final locking procedure was performed on the right and left sides.  I then packed allograft and autograft into the posterolateral gutter and along the posterior elements on the left side to help aid in the success of the fusion.  I was very pleased with the final AP and lateral fluoroscopic images.  Of note, the screws on the right side were tested using triggered EMG and there was no screw that tested below 15 milliamps.  All bleeding was controlled.  A #15 deep Blake drain was then placed.  There was no extravasation of cerebrospinal fluid throughout the entire surgery, and there was no abnormal EMG activity noted throughout the entire surgery. The wound was then closed in layers using #1 Vicryl, followed by 0 Vicryl, followed by 2-0 Vicryl, followed by 4-0 Monocryl.  Benzoin and Steri-Strips were applied, followed by sterile dressing.  All instrument counts were correct at the termination of the procedure.  Of note, Jason CoopKayla McKenzie, PA-C, was my assistant throughout surgery, and did aid in  retraction, suctioning, and closure from start to finish.    Estill BambergMark Haden Suder, MD   ______________________________ Estill BambergMark Kerah Hardebeck, MD   MD/MEDQ  D:  04/12/2017  T:  04/12/2017  Job:  [id number]

## 2017-04-13 NOTE — Progress Notes (Signed)
OT Cancellation Note  Patient Details Name: Derrick Padilla MRN: [id number] DOB: [birth year]   Cancelled Treatment:    Reason Eval/Treat Not Completed: Other (comment). Pt in a lot of pain an NT in room trying to get pt comfortable in bed--A'd her with this. Able to get pt's AE needs (long reacher, sock aid, long shoe horn, and long sponge) and convey to CM. Will return at later time to complete eval.  Evette GeorgesLeonard, Derrick Padilla Derrick Padilla [phone] 04/13/2017, 3:51 PM

## 2017-04-13 NOTE — Evaluation (Signed)
Physical Therapy Evaluation Patient Details Name: Derrick Padilla MRN: [id number] DOB: 1971-03-03 Today's Date: 04/13/2017   History of Present Illness  46 y.o. male admitted on 04/12/17 for L3-S1 fusion.  Pt with significant PM[facility] of [facility], [facility], L knee arthroscopy, L carpal tunnel release.   Clinical Impression  Pt was able to walk into the hallway this PM.  BPs are soft, yet stable throughout gait. Chair followed, but not needed and back education initiated with pt and his wife.  Wife to be home through the weekend before returning to work on Monday.   PT to follow acutely for deficits listed below.       Follow Up Recommendations No PT follow up    Equipment Recommendations  Rolling walker with 5" wheels    Recommendations for Other Services   NA    Precautions / Restrictions Precautions Precautions: Back Precaution Booklet Issued: No Precaution Comments: back precautions reviewed with the pt and his wife. Required Braces or Orthoses: Spinal Brace Spinal Brace: Thoracolumbosacral orthotic;Applied in sitting position Restrictions Weight Bearing Restrictions: No      Mobility  Bed Mobility Overal bed mobility: Needs Assistance Bed Mobility: Sit to Sidelying         Sit to sidelying: Mod assist General bed mobility comments: Mod assist to help lift both legs back into the bed, Verbal cues for reverse log roll technique to get back into bed safely.   Transfers Overall transfer level: Needs assistance   Transfers: Sit to/from Stand Sit to Stand: +2 physical assistance;Min assist;From elevated surface         General transfer comment: Two person min assist to support trunk and stabilize RW during transitions.  Verbal cues for safe hand placement.   Ambulation/Gait Ambulation/Gait assistance: +2 safety/equipment;Min assist Ambulation Distance (Feet): 85 Feet Assistive device: Rolling walker (2 wheeled) Gait Pattern/deviations: Step-through  pattern Gait velocity: decreased Gait velocity interpretation: <1.8 ft/sec, indicative of risk for recurrent falls General Gait Details: Pt with slow gait with RW, verbal cues for upright posture, take weight off of his hands (as they were tingling) and put more on his legs. Pt reported fatigue more than lightheadedness and BPs were soft, yet stable before and after walking (104/64, 106/63).           Balance Overall balance assessment: Needs assistance Sitting-balance support: Feet supported;Bilateral upper extremity supported Sitting balance-Leahy Scale: Fair     Standing balance support: Bilateral upper extremity supported Standing balance-Leahy Scale: Poor                               Pertinent Vitals/Pain Pain Assessment: 0-10 Pain Score: 6  Pain Location: incisional Pain Descriptors / Indicators: Aching;Burning Pain Intervention(s): Limited activity within patient's tolerance;Monitored during session;Repositioned;Premedicated before session    Home Living Family/patient expects to be discharged to:: Private residence Living Arrangements: Spouse/significant other;Other relatives Available Help at Discharge: Family;Available 24 hours/day (Through the weekend, then wife returns to work) Type of Home: House Home Access: Stairs to enter Entrance Stairs-Rails: Doctor, general practiceight;Left Entrance Stairs-Number of Steps: 4 Home Layout: Multi-level Home Equipment: Shower seat      Prior Function Level of Independence: Independent         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         Extremity/Trunk Assessment   Upper Extremity Assessment Upper Extremity Assessment: RUE deficits/detail;LUE deficits/detail RUE Sensation: decreased light touch (tingling in ulnar  nerve distribution) LUE Sensation: decreased light touch (more on right, but left with RW use.)         Cervical / Trunk Assessment Cervical / Trunk Assessment: Normal  Communication    Communication: No difficulties  Cognition Arousal/Alertness: Lethargic;Suspect due to medications Behavior During Therapy: [facility] for tasks assessed/performed Overall Cognitive Status: Within Functional Limits for tasks assessed                                               Assessment/Plan    PT Assessment Patient needs continued PT services  PT Problem List Decreased strength;Decreased activity tolerance;Decreased balance;Decreased mobility;Decreased knowledge of use of DME;Decreased knowledge of precautions;Impaired sensation;Pain       PT Treatment Interventions DME instruction;Gait training;Stair training;Functional mobility training;Therapeutic activities;Therapeutic exercise;Balance training;Neuromuscular re-education;Cognitive remediation;Patient/family education    PT Goals (Current goals can be found in the Care Plan section)  Acute Rehab PT Goals Patient Stated Goal: to go home tomorrow if he can PT Goal Formulation: With patient Time For Goal Achievement: 04/27/17 Potential to Achieve Goals: Good    Frequency Min 5X/week           AM-PAC PT "6 Clicks" Daily Activity  Outcome Measure Difficulty turning over in bed (including adjusting bedclothes, sheets and blankets)?: Total Difficulty moving from lying on back to sitting on the side of the bed? : Total Difficulty sitting down on and standing up from a chair with arms (e.g., wheelchair, bedside commode, etc,.)?: Total Help needed moving to and from a bed to chair (including a wheelchair)?: A Little Help needed walking in hospital room?: A Little Help needed climbing 3-5 steps with a railing? : A Lot 6 Click Score: 11    End of Session Equipment Utilized During Treatment: Gait belt;Back brace Activity Tolerance: Patient limited by pain;Patient limited by fatigue Patient left: in bed;with call bell/phone within reach;with family/visitor present Nurse Communication: Mobility status PT Visit  Diagnosis: Muscle weakness (generalized) (M62.81);Other symptoms and signs involving the nervous system (R29.898);Pain Pain - Right/Left:  (lower) Pain - part of body:  (back)    Time: 1610-9604 PT Time Calculation (min) (ACUTE ONLY): 43 min   Charges:           Lurena Joiner B. Mohid Furuya, PT, DPT [phone]   PT Evaluation $PT Eval Moderate Complexity: 1 Mod PT Treatments $Gait Training: 8-22 mins $Therapeutic Activity: 8-22 mins   04/13/2017, 3:39 PM

## 2017-04-13 NOTE — Progress Notes (Addendum)
Per Night Shift RN, Michele Mcalpinehil, she put patient in chair with brace; he sat for approximately 5 minutes and felt faint.  BP 97/36 P 51.  Back to bed. No other complaints. Will notifiy MD.  Writer checked BP at 9:12 93/46; P64; O2 88% on RM air; placed on 2L/Green Level 94%.  Pt denies dizziness, nausea.

## 2017-04-13 NOTE — Progress Notes (Signed)
Pt encouraged to use his IS and deep breathing exercises. Pt able to demonstrate back to RN. Pt OOB and ambulated from room to nursing station 100 ft with 1 person assist and spouse at side. Pt back to bed with call light within reach and spouse at bedside. Will closely monitor pt. Dionne BucyP. Amo Yadier Bramhall RN

## 2017-04-13 NOTE — Progress Notes (Signed)
Pt oob and ambulated to the chair with 1 assist. Pt denies any dizziness, or discomfort. Voices "am Ok". Pt sitting up in chair; IS education completed and pt able to demonstrate back to RN. Pt encouraged to use IS; call light within reach and family at bedside. Will closely monitor. Dionne BucyP. Amo Kadyn Chovan RN

## 2017-04-14 LAB — PREPARE RBC (CROSSMATCH)

## 2017-04-14 LAB — CBC
HEMATOCRIT: 28.8 % — AB (ref 39.0–52.0)
Hemoglobin: 9.5 g/dL — ABNORMAL LOW (ref 13.0–17.0)
MCH: 27.9 pg (ref 26.0–34.0)
MCHC: 33 g/dL (ref 30.0–36.0)
MCV: 84.5 fL (ref 78.0–100.0)
Platelets: 169 10*3/uL (ref 150–400)
RBC: 3.41 MIL/uL — ABNORMAL LOW (ref 4.22–5.81)
RDW: 14.3 % (ref 11.5–15.5)
WBC: 10.8 10*3/uL — ABNORMAL HIGH (ref 4.0–10.5)

## 2017-04-14 MED ORDER — SODIUM CHLORIDE 0.9 % IV SOLN: Freq: Once | INTRAVENOUS | Status: AC

## 2017-04-14 MED ORDER — SODIUM CHLORIDE 0.9 % IV SOLN
Freq: Once | INTRAVENOUS | Status: AC
  Administered 2017-04-14: 11:00:00 via INTRAVENOUS

## 2017-04-14 MED FILL — Thrombin For Soln 20000 Unit: CUTANEOUS | Qty: 2 | Status: AC

## 2017-04-14 NOTE — Evaluation (Signed)
Occupational Therapy Evaluation Patient Details Name: Derrick Padilla MRN: [id number] DOB: Jul 16, 1971 Today's Date: 04/14/2017    History of Present Illness 46 y.o. male admitted on 04/12/17 for L3-S1 fusion.  Pt with significant PMH of meningitis, [facility] Spoted Fever, L knee arthroscopy, L carpal tunnel release.    Clinical Impression   This 46 yo male admitted and underwent above presents to acute OT with all education completed, we will D/C from acute OT.    Follow Up Recommendations  No OT follow up;Supervision/Assistance - 24 hour    Equipment Recommendations  Other (comment) (reacher, sock aid, long shoe horn, long sponge, toilet aid)       Precautions / Restrictions Precautions Precautions: Back Precaution Comments: back precautions reviewed with the pt and his wife. Required Braces or Orthoses: Spinal Brace Spinal Brace: Thoracolumbosacral orthotic;Applied in sitting position Restrictions Weight Bearing Restrictions: No      Mobility Bed Mobility Overal bed mobility: Needs Assistance Bed Mobility: Sidelying to Sit   Sidelying to sit: Mod assist (use of rail, HOB elevated to 20 degrees,)       General bed mobility comments: pt already on his side when I entered; needed A for elevation of trunk even though HOB at 20 degrees  Transfers Overall transfer level: Needs assistance Equipment used: Rolling walker (2 wheeled) Transfers: Sit to/from Stand Sit to Stand: Min guard;From elevated surface                  ADL either performed or assessed with clinical judgement   ADL Overall ADL's : Needs assistance/impaired Eating/Feeding: Independent;Sitting   Grooming: Set up;Sitting   Upper Body Bathing: Set up;Supervision/ safety;Sitting   Lower Body Bathing: Maximal assistance Lower Body Bathing Details (indicate cue type and reason): min guard A sit<>stand Upper Body Dressing : Set up;Supervision/safety;Sitting   Lower Body Dressing: Maximal  assistance Lower Body Dressing Details (indicate cue type and reason): min guard A sit<>stand Toilet Transfer: Min guard;Ambulation;RW   Toileting- Clothing Manipulation and Hygiene: Moderate assistance Toileting - Clothing Manipulation Details (indicate cue type and reason): min guard A sit<>stand   Tub/Shower Transfer Details (indicate cue type and reason): Educated pt and wife on side step into tub   General ADL Comments: Educated pt and wife on use of 2 cups for brushing teeth to avoid bending over sink; using wet wipes with toilet aid to help with back peri cleaning to avoid twisting as much as possible. Discussed pt staying upstairs first week or 2 due to the only bathroom is upstairs.Educated on use of AE for LBD and pt returned demonstrated     Vision Patient Visual Report: No change from baseline              Pertinent Vitals/Pain Pain Assessment: 0-10 Pain Score: 4  Pain Location: incisional Pain Descriptors / Indicators: Sore Pain Intervention(s): Limited activity within patient's tolerance;Monitored during session     Hand Dominance Right      Communication Communication Communication: No difficulties   Cognition Arousal/Alertness: Awake/alert Behavior During Therapy: WFL for tasks assessed/performed Overall Cognitive Status: Within Functional Limits for tasks assessed                                                Home Living Family/patient expects to be discharged to:: Private residence Living Arrangements: Spouse/significant other;Other relatives Available Help at  Discharge: Family;Available 24 hours/day (wife through weekend then mom during the day/wife at night) Type of Home: House Home Access: Stairs to enter Enter[employer] CorporationEntrance Stairs-Number of Steps: 4 Entrance Stairs-Rails: Right;Left Home Layout: Multi-level Alternate Level Stairs-Number of Steps: 7 Alternate Level Stairs-Rails: Right Bathroom Shower/Tub: Tub/shower unit;Curtain    Bathroom Toilet: Handicapped height     Home Equipment: Shower seat          Prior Functioning/Environment Level of Independence: Independent        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         OT Problem List: Decreased strength;Decreased range of motion;Impaired balance (sitting and/or standing);Obesity;Pain;Decreased knowledge of precautions      OT Treatment/Interventions:      OT Goals(Current goals can be found in the care plan section) Acute Rehab OT Goals Patient Stated Goal: to go home tomorrow  OT Frequency:                AM-PAC PT "6 Clicks" Daily Activity     Outcome Measure Help from another person eating meals?: None Help from another person taking care of personal grooming?: A Little Help from another person toileting, which includes using toliet, bedpan, or urinal?: A Little Help from another person bathing (including washing, rinsing, drying)?: A Lot Help from another person to put on and taking off regular upper body clothing?: A Little Help from another person to put on and taking off regular lower body clothing?: A Lot 6 Click Score: 17   End of Session Equipment Utilized During Treatment: Rolling walker;Back brace Nurse Communication:  (BP stable (see flow sheet))  Activity Tolerance: Patient tolerated treatment well Patient left: in chair;with call bell/phone within reach;with family/visitor present  OT Visit Diagnosis: Pain;Other abnormalities of gait and mobility (R26.89) Pain - part of body:  (back)                Time: 1445-1530 OT Time Calculation (min): 45 min Charges:  OT General Charges $OT Visit: 1 Procedure OT Evaluation $OT Eval Moderate Complexity: 1 Procedure OT Treatments $Self Care/Home Management : 23-37 mins Ignacia PalmaCathy Jennavieve Arrick, OTR/L [phone] 04/14/2017

## 2017-04-14 NOTE — Progress Notes (Addendum)
    Patient PO day 2, well controlled PO LBP as expected, limited by fatigue and dizziness, has been walking short distances but feels weak. Mild SOB. States he felt lightheaded this morning when he sat up.    Physical Exam: Vitals:   04/14/17 0048 04/14/17 0526  BP: 104/62 (!) 114/54  Pulse: 88 90  Resp: 18 18  Temp: 99.7 F (37.6 C) 100.3 F (37.9 C)  SpO2: 99% 92%   CBC Latest Ref Rng & Units 04/14/2017 04/13/2017 04/06/2017  WBC 4.0 - 10.5 K/uL 10.8(H) 9.6 8.8  Hemoglobin 13.0 - 17.0 g/dL 2.1(H9.5(L) 0.8(M9.9(L) 57.813.9  Hematocrit 39.0 - 52.0 % 28.8(L) 30.4(L) 43.3  Platelets 150 - 400 K/uL 169 189 278    Dressing in place, CDI, drain in place, resting comfortably in bed, a bit pale, NVI  Drain output: 105 over 6 hours  POD #2 s/p L3-S1 decompression and fusion  - Maintain drain, nurse will monitor throughout day and call back with results to discuss D/C prior to discharge vs having pt come to clinic Saturday morning.  - Acute blood loss anemia from surgery, 13.9 pre-op, 9.5 symptomatic today. Will Transfuse 2U PRBC's - up with PT/OT, encourage ambulation - Percocet for pain, Valium for muscle spasms - likely d/c home later today after transfusion if cleared by PT/OT  Jason CoopKayla Vernon Maish, PA-C [phone] Office [phone] Cell

## 2017-04-14 NOTE — Progress Notes (Signed)
OT Cancellation Note  Patient Details Name: Derrick Padilla MRN: [id number] DOB: 02/21/1971   Cancelled Treatment:    Reason Eval/Treat Not Completed: Other (comment). Pt not feeling well, getting blood--will check back at later time for eval.  Evette GeorgesLeonard, Karman Biswell Eva [phone] 04/14/2017, 12:56 PM

## 2017-04-14 NOTE — Care Management Note (Signed)
Case Management Note  Patient Details  Name: Derrick Padilla MRN: [id number] Date of Birth: Feb 11, 1971  Subjective/Objective:                    Action/Plan: Plan when patient is medically ready is for him to d/c home with his wife. No f/u per PT. Pt does have DME needs.  [facility] [phone]) (fax: [phone]) the case manager for patients workers compensation. CM following.  Expected Discharge Date:  04/14/17               Expected Discharge Plan:  Home/Self Care  In-House Referral:     Discharge planning Services  CM Consult  Post Acute Care Choice:  Durable Medical Equipment Choice offered to:     DME Arranged:  Walker tall, Other see comment (reacher/ sockaide/ sponge/ shoe horn/ toilet aide) DME Agency:     HH Arranged:    HH Agency:     Status of Service:  Completed, signed off  If discussed at Long Length of Stay Meetings, dates discussed:    Additional Comments:  [name], RN 04/14/2017, 10:27 AM

## 2017-04-14 NOTE — Progress Notes (Signed)
Pt oob and ambulated to the BR with 1 assist; pt voided and ambulated from room to nursing station 100 ft with assistance. Pt back to bed with call light within reach and family at bedside. Will continue to closely monitor. Dionne BucyP. Amo Zanayah Shadowens RN

## 2017-04-14 NOTE — Progress Notes (Signed)
CM spoke to [employer] Weather[employer] with Workers Comp. She is going to have DME sent to patients home on Monday except the walker. [facility] to d/c. CM notified Clydie BraunKaren with [facility] and she is working with [employer] to be able to supply walker to the patient. CM following.

## 2017-04-14 NOTE — Progress Notes (Signed)
Physical Therapy Treatment Patient Details Name: Derrick Padilla MRN: [id number] DOB: 03/15/1971 Today's Date: 04/14/2017    History of Present Illness 46 y.o. male admitted on 04/12/17 for L3-S1 fusion.  Pt with significant PM[facility], L knee arthroscopy, L carpal tunnel release.     PT Comments    Pt received blood today, and BPs are up and more stable.  Pt is not progressing as quickly as I would hope, so changing my d/c rec to home with HHPT at discharge.   Pt is hopeful to d/c home tomorrow, but needs to be safe to practice the stairs for home access.    Follow Up Recommendations  Home health PT;Supervision for mobility/OOB     Equipment Recommendations  Rolling walker with 5" wheels    Recommendations for Other Services    NA    Precautions / Restrictions Precautions Precautions: Back Precaution Comments: back precautions reviewed with the pt and his wife. Required Braces or Orthoses: Spinal Brace Spinal Brace: Thoracolumbosacral orthotic;Applied in sitting position Restrictions Weight Bearing Restrictions: No    Mobility  Bed Mobility Overal bed mobility: Needs Assistance Bed Mobility: Sit to Sidelying   Sidelying to sit: Mod assist (use of rail, HOB elevated to 20 degrees,)     Sit to sidelying: Mod assist General bed mobility comments: Mod assist to help pt lift one leg at a time into bed.  Verbal cues for reverse log roll technique.   Transfers Overall transfer level: Needs assistance Equipment used: Rolling walker (2 wheeled) Transfers: Sit to/from Stand Sit to Stand: Min assist         General transfer comment: Min assist to stabilize pt's trunk as he comes up over flexed knees and stabilize RW as he transitions hands from recliner armrests to RW.   Ambulation/Gait Ambulation/Gait assistance: Min guard;+2 safety/equipment Ambulation Distance (Feet): 85 Feet Assistive device: Rolling walker (2 wheeled) Gait  Pattern/deviations: Step-through pattern;Trunk flexed Gait velocity: decreased Gait velocity interpretation: <1.8 ft/sec, indicative of risk for recurrent falls General Gait Details: Verbal cues for upright posture, chair to follow for safety, but not needed.  Pt not reporting any lightheadedness.            Balance Overall balance assessment: Needs assistance Sitting-balance support: Feet supported;Bilateral upper extremity supported Sitting balance-Leahy Scale: Fair     Standing balance support: Bilateral upper extremity supported Standing balance-Leahy Scale: Poor Standing balance comment: needs support from RW and therapist                            Cognition Arousal/Alertness: Awake/alert Behavior During Therapy: WFL for tasks assessed/performed Overall Cognitive Status: Within Functional Limits for tasks assessed                                               Pertinent Vitals/Pain Pain Assessment: 0-10 Pain Score: 7  Pain Location: incisional and bil thighs are numb from sitting Pain Descriptors / Indicators: Sore Pain Intervention(s): Limited activity within patient's tolerance;Monitored during session;Repositioned    Home Living Family/patient expects to be discharged to:: Private residence Living Arrangements: Spouse/significant other;Other relatives Available Help at Discharge: Family;Available 24 hours/day (wife through weekend then mom during the day/wife at night) Type of Home: House Home Access: Stairs to enter Entrance Stairs-Rails: Right;Left Home Layout: Multi-level Home  Equipment: Shower seat      Prior Function Level of Independence: Independent      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    PT Goals (current goals can now be found in the care plan section) Acute Rehab PT Goals Patient Stated Goal: to go home tomorrow Progress towards PT goals: Progressing toward goals    Frequency    Min  5X/week      PT Plan Current plan remains appropriate       AM-PAC PT "6 Clicks" Daily Activity  Outcome Measure  Difficulty turning over in bed (including adjusting bedclothes, sheets and blankets)?: Total Difficulty moving from lying on back to sitting on the side of the bed? : Total Difficulty sitting down on and standing up from a chair with arms (e.g., wheelchair, bedside commode, etc,.)?: Total Help needed moving to and from a bed to chair (including a wheelchair)?: A Little Help needed walking in hospital room?: A Little Help needed climbing 3-5 steps with a railing? : A Lot 6 Click Score: 11    End of Session Equipment Utilized During Treatment: Gait belt;Back brace Activity Tolerance: Patient limited by pain;Patient limited by fatigue Patient left: in bed;with call bell/phone within reach;with family/visitor present   PT Visit Diagnosis: Muscle weakness (generalized) (M62.81);Other symptoms and signs involving the nervous system (R29.898);Pain Pain - Right/Left:  (lower) Pain - part of body:  (back)     Time: 1610-9604 PT Time Calculation (min) (ACUTE ONLY): 22 min  Charges:  $Gait Training: 8-22 mins          Osei Anger B. Arohi Salvatierra, PT, DPT [phone]            04/14/2017, 5:53 PM

## 2017-04-15 LAB — BPAM RBC
BLOOD PRODUCT EXPIRATION DATE: 201808292359
Blood Product Expiration Date: 201808292359
ISSUE DATE / TIME: 201808101104
ISSUE DATE / TIME: 201808101401
UNIT TYPE AND RH: 6200
Unit Type and Rh: 6200

## 2017-04-15 LAB — TYPE AND SCREEN
ABO/RH(D): A POS
ANTIBODY SCREEN: NEGATIVE
UNIT DIVISION: 0
Unit division: 0

## 2017-04-15 LAB — CBC
HCT: 33 % — ABNORMAL LOW (ref 39.0–52.0)
HEMOGLOBIN: 11 g/dL — AB (ref 13.0–17.0)
MCH: 27.9 pg (ref 26.0–34.0)
MCHC: 33.3 g/dL (ref 30.0–36.0)
MCV: 83.8 fL (ref 78.0–100.0)
Platelets: 178 10*3/uL (ref 150–400)
RBC: 3.94 MIL/uL — AB (ref 4.22–5.81)
RDW: 14.1 % (ref 11.5–15.5)
WBC: 8.9 10*3/uL (ref 4.0–10.5)

## 2017-04-15 NOTE — Progress Notes (Signed)
Physical Therapy Treatment Patient Details Name: Derrick Padilla MRN: [id number] DOB: August 10, 1971 Today's Date: 04/15/2017    History of Present Illness 46 y.o. male admitted on 04/12/17 for L3-S1 fusion.  Pt with significant PMH of [facility]itis[facility], L knee arthroscopy, L carpal tunnel release.     PT Comments    Pt reports feeling much better today. Decreased assist required for all aspects of mobility. Stair training complete. All education complete. Pt to d/c home today. D/C rec for HHPT remain appropriate.   Follow Up Recommendations  Home health PT;Supervision for mobility/OOB     Equipment Recommendations  Rolling walker with 5" wheels    Recommendations for Other Services       Precautions / Restrictions Precautions Precautions: Back Precaution Comments: back precautions reviewed with the pt and his wife. Required Braces or Orthoses: Spinal Brace Spinal Brace: Thoracolumbosacral orthotic;Applied in sitting position Restrictions Other Position/Activity Restrictions: Pt independently doffed brace.    Mobility  Bed Mobility             Sit to sidelying: Min assist General bed mobility comments: min assist with LE. Pt demo good logroll technique.  Transfers   Equipment used: Rolling walker (2 wheeled)   Sit to Stand: Min guard         General transfer comment: Pt demo good technique.  Ambulation/Gait Ambulation/Gait assistance: Min guard Ambulation Distance (Feet): 200 Feet Assistive device: Rolling walker (2 wheeled) Gait Pattern/deviations: Step-through pattern;Decreased stride length Gait velocity: decreased Gait velocity interpretation: Below normal speed for age/gender General Gait Details: slow, steady gait   Stairs Stairs: Yes   Stair Management: One rail Right;Step to pattern;Sideways Number of Stairs: 4 General stair comments: verbal cues for sequencing. Increased time to complete.  Wheelchair Mobility    Modified  Rankin (Stroke Patients Only)       Balance                                            Cognition Arousal/Alertness: Awake/alert Behavior During Therapy: WFL for tasks assessed/performed Overall Cognitive Status: Within Functional Limits for tasks assessed                                        Exercises      General Comments        Pertinent Vitals/Pain Pain Assessment: 0-10 Pain Score: 4  Pain Location: low back Pain Descriptors / Indicators: Sore Pain Intervention(s): Monitored during session;Repositioned    Home Living                      Prior Function            PT Goals (current goals can now be found in the care plan section) Acute Rehab PT Goals Patient Stated Goal: home today PT Goal Formulation: With patient Time For Goal Achievement: 04/27/17 Potential to Achieve Goals: Good Progress towards PT goals: Progressing toward goals    Frequency    Min 5X/week      PT Plan Current plan remains appropriate    Co-evaluation              AM-PAC PT "6 Clicks" Daily Activity  Outcome Measure  Difficulty turning over in bed (including adjusting bedclothes, sheets and  blankets)?: A Little Difficulty moving from lying on back to sitting on the side of the bed? : Total Difficulty sitting down on and standing up from a chair with arms (e.g., wheelchair, bedside commode, etc,.)?: Total Help needed moving to and from a bed to chair (including a wheelchair)?: A Little Help needed walking in hospital room?: None Help needed climbing 3-5 steps with a railing? : A Little 6 Click Score: 15    End of Session Equipment Utilized During Treatment: Gait belt;Back brace Activity Tolerance: Patient tolerated treatment well Patient left: in bed;with call bell/phone within reach;with family/visitor present Nurse Communication: Mobility status PT Visit Diagnosis: Muscle weakness (generalized) (M62.81);Other symptoms and  signs involving the nervous system (R29.898);Pain     Time: 0454-0981 PT Time Calculation (min) (ACUTE ONLY): 24 min  Charges:  $Gait Training: 23-37 mins                    G Codes:       Aida Raider, PT  Office # [phone] Pager [phone]    Ilda Foil 04/15/2017, 9:36 AM

## 2017-04-15 NOTE — Discharge Summary (Signed)
Patient ID: Derrick Padilla MRN: [id number] DOB/AGE: 1971/08/10 46 y.o.  Admit date: 04/12/2017 Discharge date: 04/15/2017  Admission Diagnoses:  Active Problems:   Radiculopathy   Discharge Diagnoses:  Same  Past Medical History:  Diagnosis Date  . Arthritis    degenerative spine, has had multiple injections   . Meningitis    (viral) - hosp.- [place]  . [place] tick fever     Surgeries: Procedure(s): RIGHT SIDED LUMBAR 3-4, LUMBAR 4-5, LUMBAR 5-SACRUM 1 TRANSFORAMINAL LUMBAR INTERBODY FUSION WITH INSTRUMENTATION AND ALLOGRAFT on 04/12/2017   Consultants:   Discharged Condition: Improved  Hospital Course: Derrick Lagerric L Cardiff is an 46 y.o. male who was admitted 04/12/2017 for operative treatment of<principal problem not specified>. Patient has severe unremitting pain that affects sleep, daily activities, and work/hobbies. After pre-op clearance the patient was taken to the operating room on 04/12/2017 and underwent  Procedure(s): RIGHT SIDED LUMBAR 3-4, LUMBAR 4-5, LUMBAR 5-SACRUM 1 TRANSFORAMINAL LUMBAR INTERBODY FUSION WITH INSTRUMENTATION AND ALLOGRAFT.    Patient was given perioperative antibiotics: Anti-infectives    Start     Dose/Rate Route Frequency Ordered Stop   04/13/17 0100  ceFAZolin (ANCEF) IVPB 2g/100 mL premix     2 g 200 mL/hr over 30 Minutes Intravenous Every 8 hours 04/12/17 2213 04/13/17 1023   04/12/17 1606  ceFAZolin (ANCEF) 2-4 GM/100ML-% IVPB    Comments:  Merril AbbeButler, David   : cabinet override      04/12/17 1606 04/13/17 0414   04/12/17 0800  ceFAZolin (ANCEF) 3 g in dextrose 5 % 50 mL IVPB     3 g 130 mL/hr over 30 Minutes Intravenous To [facility]Stay [facility] 04/11/17 1231 04/12/17 1715       Patient was given sequential compression devices, early ambulation, and chemoprophylaxis to prevent DVT.  Patient benefited maximally from hospital stay and there were no complications.    Recent vital signs: Patient Vitals for the past 24 hrs:  BP Temp Temp src Pulse  Resp SpO2  04/15/17 0449 (!) 104/46 98.9 F (37.2 C) Oral 73 18 95 %  04/15/17 0117 (!) 101/58 99.7 F (37.6 C) Oral 70 18 93 %  04/14/17 2136 (!) 97/45 99.6 F (37.6 C) Oral 84 18 96 %  04/14/17 1812 (!) 96/43 100 F (37.8 C) Oral 77 17 91 %  04/14/17 1715 (!) 108/51 98.6 F (37 C) Oral 87 18 98 %  04/14/17 1430 (!) 111/51 98.2 F (36.8 C) Oral 84 18 100 %  04/14/17 1414 (!) 107/52 99.5 F (37.5 C) Oral 79 18 98 %  04/14/17 1406 110/61 99.7 F (37.6 C) Oral 86 18 94 %  04/14/17 1129 (!) 109/58 99.7 F (37.6 C) Oral 82 18 98 %  04/14/17 1106 (!) 104/46 98.8 F (37.1 C) Oral 88 19 95 %  04/14/17 0945 (!) 93/47 99.1 F (37.3 C) Oral 90 19 92 %     Recent laboratory studies:  Recent Labs  04/14/17 0349 04/15/17 0605  WBC 10.8* 8.9  HGB 9.5* 11.0*  HCT 28.8* 33.0*  PLT 169 178     Discharge Medications:   Allergies as of 04/15/2017      Reactions   No Known Allergies       Medication List    You have not been prescribed any medications.          Durable Medical Equipment        Start     Ordered   04/14/17 1016  For home use  only DME Walker tall  Once    Comments:  Also needs to be wide size  Question:  Patient needs a walker to treat with the following condition  Answer:  Status post lumbar surgery   04/14/17 1016   04/14/17 1014  For home use only DME Other see comment  Once    Comments:  Long reacher Long sock aide Long sponge Long shoe horn Toilet aide   04/14/17 1014      Diagnostic Studies: Dg Chest 2 View  Result Date: 04/06/2017 CLINICAL DATA:  Preop for lumbar spine surgery EXAM: CHEST  2 VIEW COMPARISON:  None. FINDINGS: No active infiltrate or effusion is seen. Mediastinal and hilar contours are unremarkable. The heart is within normal limits in size. No acute bony abnormality is seen. IMPRESSION: No active cardiopulmonary disease. Electronically Signed   By: Dwyane Dee M.D.   On: 04/06/2017 16:31   Dg Lumbar Spine 2-3 Views  Result  Date: 04/12/2017 CLINICAL DATA:  Lumbar fusion. EXAM: DG C-ARM GT 120 MIN; LUMBAR SPINE - 2-3 VIEW COMPARISON:  None. FINDINGS: C-arm images demonstrate posterior fusion hardware across the L3-4, L4-5 and L5-S1 levels. Interbody bone graft material also noted. Alignment appears anatomic. IMPRESSION: Imaging during posterior lumbar interbody fusion at L3-4, L4-5 and L5-S1. Electronically Signed   By: Irish Lack M.D.   On: 04/12/2017 21:06   Dg Lumbar Spine 1 View  Result Date: 04/12/2017 CLINICAL DATA:  Back surgery EXAM: LUMBAR SPINE - 1 VIEW COMPARISON:  None. FINDINGS: Single lateral lumbar spine image in the operating room Lowest disc space is assumed to be L5-S1. Disc space narrowing L3-4, L4-5, L5-S1 Superficial needle directed toward the L3 spinous process Superficial needle directed toward the mid sacrum. IMPRESSION: Lumbar localization in the operating room. Electronically Signed   By: Marlan Palau M.D.   On: 04/12/2017 09:41   Dg C-arm Gt 120 Min  Result Date: 04/12/2017 CLINICAL DATA:  Lumbar fusion. EXAM: DG C-ARM GT 120 MIN; LUMBAR SPINE - 2-3 VIEW COMPARISON:  None. FINDINGS: C-arm images demonstrate posterior fusion hardware across the L3-4, L4-5 and L5-S1 levels. Interbody bone graft material also noted. Alignment appears anatomic. IMPRESSION: Imaging during posterior lumbar interbody fusion at L3-4, L4-5 and L5-S1. Electronically Signed   By: Irish Lack M.D.   On: 04/12/2017 21:06    Disposition: Final discharge disposition not confirmed  Discharge Instructions    Call MD / Call 911    Complete by:  As directed    If you experience chest pain or shortness of breath, CALL 911 and be transported to the hospital emergency room.  If you develope a fever above 101 F, pus (white drainage) or increased drainage or redness at the wound, or calf pain, call your surgeon's office.   Constipation Prevention    Complete by:  As directed    Drink plenty of fluids.  Prune juice may be  helpful.  You may use a stool softener, such as Colace (over the counter) 100 mg twice a day.  Use MiraLax (over the counter) for constipation as needed.   Diet - low sodium heart healthy    Complete by:  As directed    Increase activity slowly as tolerated    Complete by:  As directed          Signed: Deeanne Deininger, Ginger Organ 04/15/2017, 7:54 AM

## 2017-04-15 NOTE — Progress Notes (Signed)
Spoke w patient's family while he was w PT. They have received RW in room, expressed no additional needs. Will DC to home today.

## 2017-04-15 NOTE — Progress Notes (Signed)
Subjective: 3 Days Post-Op Procedure(s) (LRB): RIGHT SIDED LUMBAR 3-4, LUMBAR 4-5, LUMBAR 5-SACRUM 1 TRANSFORAMINAL LUMBAR INTERBODY FUSION WITH INSTRUMENTATION AND ALLOGRAFT (Right)  Activity level:  wbat Diet tolerance:  ok Voiding:  ok Patient reports pain as mild and moderate.    Objective: Vital signs in last 24 hours: Temp:  [98.2 F (36.8 C)-100 F (37.8 C)] 98.9 F (37.2 C) (08/11 0449) Pulse Rate:  [70-90] 73 (08/11 0449) Resp:  [17-19] 18 (08/11 0449) BP: (93-111)/(43-61) 104/46 (08/11 0449) SpO2:  [91 %-100 %] 95 % (08/11 0449)  Labs:  Recent Labs  04/13/17 0438 04/14/17 0349 04/15/17 0605  HGB 9.9* 9.5* 11.0*    Recent Labs  04/14/17 0349 04/15/17 0605  WBC 10.8* 8.9  RBC 3.41* 3.94*  HCT 28.8* 33.0*  PLT 169 178   No results for input(s): NA, K, CL, CO2, BUN, CREATININE, GLUCOSE, CALCIUM in the last 72 hours. No results for input(s): LABPT, INR in the last 72 hours.  Physical Exam:  Neurologically intact ABD soft Neurovascular intact Sensation intact distally Intact pulses distally Dorsiflexion/Plantar flexion intact Incision: dressing C/D/I and no drainage No cellulitis present Compartment soft  Assessment/Plan:  3 Days Post-Op Procedure(s) (LRB): RIGHT SIDED LUMBAR 3-4, LUMBAR 4-5, LUMBAR 5-SACRUM 1 TRANSFORAMINAL LUMBAR INTERBODY FUSION WITH INSTRUMENTATION AND ALLOGRAFT (Right) Advance diet Up with therapy Discharge home with home health today after PT this morning. Drain pulled this morning as last 6hrs output only 25. 65 total in last 24hrs.  Continue on Percocet and valium for pain and spasm. Follow up with Dr. Yevette Edwardsumonski as scheduled  Brienne Liguori, Ginger OrganNDREW PAUL 04/15/2017, 7:50 AM

## 2017-04-15 NOTE — Progress Notes (Signed)
D/c reviewed with patient and spouse. Encouraged to follow up with MD.

## 2017-05-22 ENCOUNTER — Encounter (HOSPITAL_COMMUNITY): Payer: Self-pay | Admitting: Orthopedic Surgery

## 2017-06-05 ENCOUNTER — Encounter (HOSPITAL_COMMUNITY): Payer: Self-pay | Admitting: Orthopedic Surgery

## 2017-12-30 IMAGING — RF DG C-ARM GT 120 MIN
1 series · 2 of 2 positions shown · non-contrast
Comparison: None.

CLINICAL DATA: Lumbar fusion.

EXAM:
DG C-ARM GT 120 MIN; LUMBAR SPINE - 2-3 VIEW

[Series 1: run · 2 of 2 slices shown]
[im 1/2]
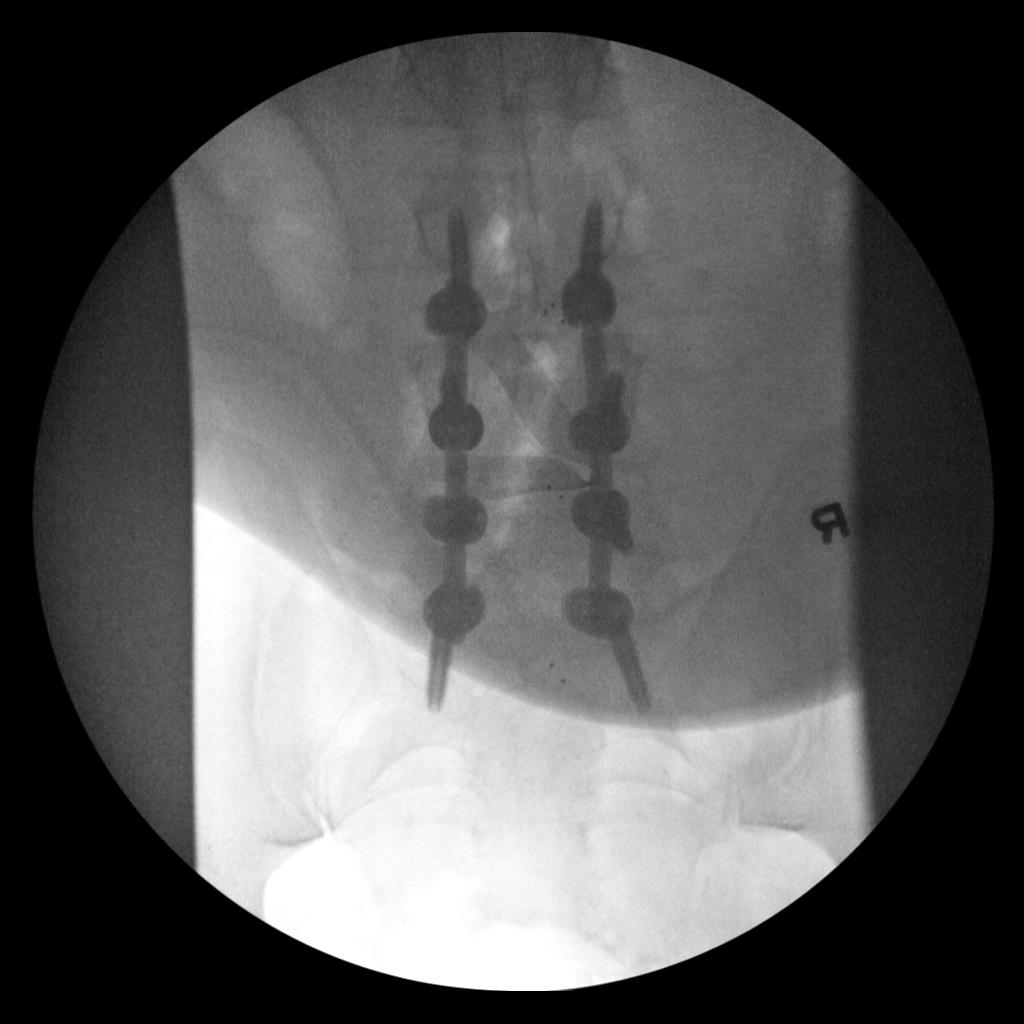
[im 2/2]
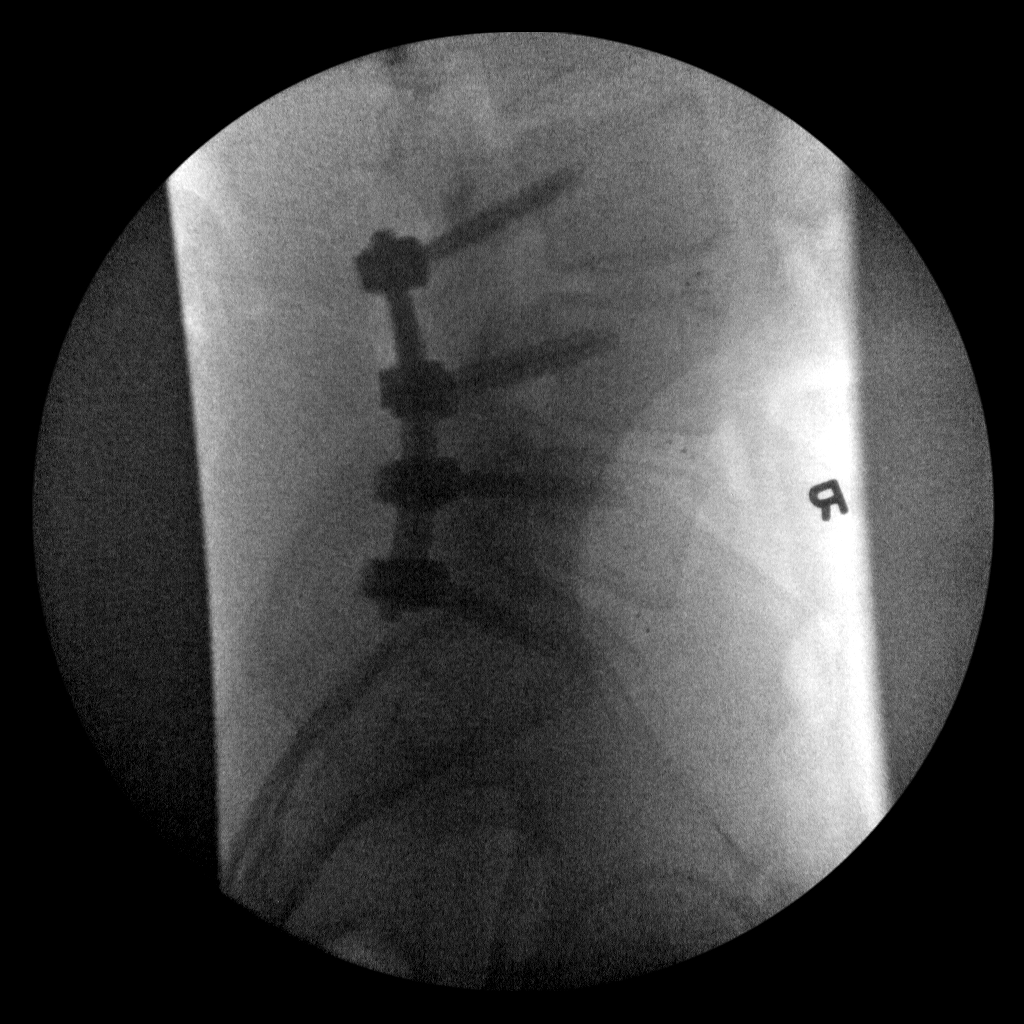

[2 of 2 positions shown; findings below may reference images not displayed]

FINDINGS: C-arm images demonstrate posterior fusion hardware across the L3-4,
L4-5 and L5-S1 levels. Interbody bone graft material also noted.
Alignment appears anatomic.
IMPRESSION: Imaging during posterior lumbar interbody fusion at L3-4, L4-5 and
L5-S1.
